# Patient Record
Sex: Female | Born: 1995 | Race: Black or African American | Hispanic: No | Marital: Single | Smoking: Never smoker
Health system: Southern US, Community
[De-identification: ages and names within clinical notes are randomized; demographics above are authoritative.]

## PROBLEM LIST (undated history)

## (undated) DIAGNOSIS — Z789 Other specified health status: Secondary | ICD-10-CM

## (undated) HISTORY — DX: Other specified health status: Z78.9

## (undated) HISTORY — PX: TONSILLECTOMY: SUR1361

---

## 2020-11-28 ENCOUNTER — Inpatient Hospital Stay (HOSPITAL_COMMUNITY): Payer: BC Managed Care – PPO

## 2020-11-28 ENCOUNTER — Other Ambulatory Visit: Payer: Self-pay

## 2020-11-28 ENCOUNTER — Encounter (HOSPITAL_COMMUNITY): Payer: Self-pay | Admitting: Obstetrics and Gynecology

## 2020-11-28 ENCOUNTER — Inpatient Hospital Stay (HOSPITAL_COMMUNITY)
Admission: AD | Admit: 2020-11-28 | Discharge: 2020-11-28 | Disposition: A | Discharge status: Home / Self Care | Payer: BC Managed Care – PPO | Attending: Obstetrics and Gynecology | Admitting: Obstetrics and Gynecology

## 2020-11-28 DIAGNOSIS — O26891 Other specified pregnancy related conditions, first trimester: Secondary | ICD-10-CM | POA: Diagnosis not present

## 2020-11-28 DIAGNOSIS — Z3A01 Less than 8 weeks gestation of pregnancy: Secondary | ICD-10-CM | POA: Insufficient documentation

## 2020-11-28 DIAGNOSIS — O4691 Antepartum hemorrhage, unspecified, first trimester: Secondary | ICD-10-CM

## 2020-11-28 DIAGNOSIS — R109 Unspecified abdominal pain: Secondary | ICD-10-CM | POA: Diagnosis not present

## 2020-11-28 DIAGNOSIS — O209 Hemorrhage in early pregnancy, unspecified: Secondary | ICD-10-CM | POA: Insufficient documentation

## 2020-11-28 DIAGNOSIS — Z6740 Type O blood, Rh positive: Secondary | ICD-10-CM | POA: Diagnosis not present

## 2020-11-28 DIAGNOSIS — O3680X0 Pregnancy with inconclusive fetal viability, not applicable or unspecified: Secondary | ICD-10-CM | POA: Diagnosis not present

## 2020-11-28 DIAGNOSIS — O4690 Antepartum hemorrhage, unspecified, unspecified trimester: Secondary | ICD-10-CM

## 2020-11-28 LAB — COMPREHENSIVE METABOLIC PANEL
ALT: 12 U/L (ref 0–44)
AST: 16 U/L (ref 15–41)
Albumin: 4 g/dL (ref 3.5–5.0)
Alkaline Phosphatase: 52 U/L (ref 38–126)
Anion gap: 10 (ref 5–15)
BUN: 13 mg/dL (ref 6–20)
CO2: 23 mmol/L (ref 22–32)
Calcium: 9.6 mg/dL (ref 8.9–10.3)
Chloride: 104 mmol/L (ref 98–111)
Creatinine, Ser: 0.85 mg/dL (ref 0.44–1.00)
GFR, Estimated: >60 mL/min (ref >60)
Glucose, Bld: 90 mg/dL (ref 70–99)
Potassium: 4.5 mmol/L (ref 3.5–5.1)
Sodium: 137 mmol/L (ref 135–145)
Total Bilirubin: 0.3 mg/dL (ref 0.3–1.2)
Total Protein: 6.9 g/dL (ref 6.5–8.1)

## 2020-11-28 LAB — URINALYSIS, ROUTINE W REFLEX MICROSCOPIC
Bilirubin Urine: NEGATIVE
Glucose, UA: NEGATIVE mg/dL
Ketones, ur: NEGATIVE mg/dL
Leukocytes,Ua: NEGATIVE
Nitrite: NEGATIVE
Protein, ur: 100 mg/dL — AB
Specific Gravity, Urine: 1.014 (ref 1.005–1.030)
pH: 7 (ref 5.0–8.0)

## 2020-11-28 LAB — CBC WITH DIFFERENTIAL/PLATELET
Abs Immature Granulocytes: 0.04 10*3/uL (ref 0.00–0.07)
Basophils Absolute: 0.1 10*3/uL (ref 0.0–0.1)
Basophils Relative: 1 %
Eosinophils Absolute: 0.1 10*3/uL (ref 0.0–0.5)
Eosinophils Relative: 1 %
HCT: 37.2 % (ref 36.0–46.0)
Hemoglobin: 12.2 g/dL (ref 12.0–15.0)
Immature Granulocytes: 0 %
Lymphocytes Relative: 23 %
Lymphs Abs: 2.4 10*3/uL (ref 0.7–4.0)
MCH: 31 pg (ref 26.0–34.0)
MCHC: 32.8 g/dL (ref 30.0–36.0)
MCV: 94.4 fL (ref 80.0–100.0)
Monocytes Absolute: 0.6 10*3/uL (ref 0.1–1.0)
Monocytes Relative: 6 %
Neutro Abs: 7.1 10*3/uL (ref 1.7–7.7)
Neutrophils Relative %: 69 %
Platelets: 447 10*3/uL — ABNORMAL HIGH (ref 150–400)
RBC: 3.94 MIL/uL (ref 3.87–5.11)
RDW: 12.2 % (ref 11.5–15.5)
WBC: 10.3 10*3/uL (ref 4.0–10.5)
nRBC: 0 % (ref 0.0–0.2)

## 2020-11-28 LAB — ABO/RH: ABO/RH(D): O POS

## 2020-11-28 LAB — POCT PREGNANCY, URINE: Preg Test, Ur: POSITIVE — AB

## 2020-11-28 LAB — HCG, QUANTITATIVE, PREGNANCY: hCG, Beta Chain, Quant, S: 778 m[IU]/mL — ABNORMAL HIGH (ref <5)

## 2020-11-28 MED ORDER — KETOROLAC TROMETHAMINE 60 MG/2ML IM SOLN
60.0000 mg | Freq: Once | INTRAMUSCULAR | Status: AC
  Administered 2020-11-28: 20:00:00 60 mg via INTRAMUSCULAR
  Filled 2020-11-28: qty 2

## 2020-11-28 MED ORDER — IBUPROFEN 600 MG PO TABS: 600.0000 mg | ORAL_TABLET | Freq: Four times a day (QID) | ORAL | 0 refills | Status: AC | PRN

## 2020-11-28 MED ORDER — HYDROCODONE-ACETAMINOPHEN 5-325 MG PO TABS: 1.0000 | ORAL_TABLET | Freq: Four times a day (QID) | ORAL | 0 refills | Status: AC | PRN

## 2020-11-28 NOTE — MAU Note (Signed)
Presents with c/o VB and abdominal cramping.  States VB began today, hasn't worn a sanitary napkin since VB began, reports a lot blood noted upon using restroom.  LMP 10/14/2020.

## 2020-11-28 NOTE — MAU Provider Note (Signed)
History     CSN: [id number]  Arrival date and time: 11/28/20 1625   None     Chief Complaint  Patient presents with  . Vaginal Bleeding  . Abdominal Pain   HPI  Ms.Tanya Reynolds is a 24 y.o. female G1P0 @ [redacted]w[redacted]d with certain LMP here with vaginal bleeding and abdominal pain. She reports Vaginal bleeding and abdominal pain that started today. The pain comes and goes.  The lower abdominal pain radiates around to her lower back. She rates her pain 5/10 at this time. She has not taken anything for the pain.   OB History    Gravida  1   Para      Term      Preterm      AB      Living        SAB      IAB      Ectopic      Multiple      Live Births              Past Medical History:  Diagnosis Date  . Medical history non-contributory     Past Surgical History:  Procedure Laterality Date  . TONSILLECTOMY      Family History  Problem Relation Age of Onset  . Heart attack Maternal Grandfather   . Diabetes Maternal Grandfather   . Heart disease Paternal Grandmother     Social History   Tobacco Use  . Smoking status: Never Smoker  . Smokeless tobacco: Never Used  Substance Use Topics  . Alcohol use: Not Currently    Allergies: No Known Allergies  No medications prior to admission.   Results for orders placed or performed during the hospital encounter of 11/28/20 (from the past 48 hour(s))  Pregnancy, urine POC     Status: Abnormal   Collection Time: 11/28/20  4:39 PM  Result Value Ref Range   Preg Test, Ur POSITIVE (A) NEGATIVE    Comment:        THE SENSITIVITY OF THIS METHODOLOGY IS >24 mIU/mL   Urinalysis, Routine w reflex microscopic Urine, Clean Catch     Status: Abnormal   Collection Time: 11/28/20  4:46 PM  Result Value Ref Range   Color, Urine YELLOW YELLOW   APPearance CLEAR CLEAR   Specific Gravity, Urine 1.014 1.005 - 1.030   pH 7.0 5.0 - 8.0   Glucose, UA NEGATIVE NEGATIVE mg/dL   Hgb urine dipstick SMALL (A)  NEGATIVE   Bilirubin Urine NEGATIVE NEGATIVE   Ketones, ur NEGATIVE NEGATIVE mg/dL   Protein, ur 893 (A) NEGATIVE mg/dL   Nitrite NEGATIVE NEGATIVE   Leukocytes,Ua NEGATIVE NEGATIVE   RBC / HPF 0-5 0 - 5 RBC/hpf   WBC, UA 0-5 0 - 5 WBC/hpf   Bacteria, UA RARE (A) NONE SEEN   Squamous Epithelial / LPF 0-5 0 - 5   Mucus PRESENT     Comment: Performed at [facility], [address] [place], [place] [address]  ABO/Rh     Status: None   Collection Time: 11/28/20  6:28 PM  Result Value Ref Range   ABO/RH(D) O POS    No rh immune globuloin      NOT A RH IMMUNE GLOBULIN CANDIDATE, PT RH POSITIVE Performed at [facility], [address] [place], [place] [address]   CBC with Differential/Platelet     Status: Abnormal   Collection Time: 11/28/20  6:29 PM  Result Value Ref  Range   WBC 10.3 4.0 - 10.5 K/uL   RBC 3.94 3.87 - 5.11 MIL/uL   Hemoglobin 12.2 12.0 - 15.0 g/dL   HCT 67.6 72.0 - 94.7 %   MCV 94.4 80.0 - 100.0 fL   MCH 31.0 26.0 - 34.0 pg   MCHC 32.8 30.0 - 36.0 g/dL   RDW 09.6 28.3 - 66.2 %   Platelets 447 (H) 150 - 400 K/uL   nRBC 0.0 0.0 - 0.2 %   Neutrophils Relative % 69 %   Neutro Abs 7.1 1.7 - 7.7 K/uL   Lymphocytes Relative 23 %   Lymphs Abs 2.4 0.7 - 4.0 K/uL   Monocytes Relative 6 %   Monocytes Absolute 0.6 0.1 - 1.0 K/uL   Eosinophils Relative 1 %   Eosinophils Absolute 0.1 0.0 - 0.5 K/uL   Basophils Relative 1 %   Basophils Absolute 0.1 0.0 - 0.1 K/uL   Immature Granulocytes 0 %   Abs Immature Granulocytes 0.04 0.00 - 0.07 K/uL    Comment: Performed at [facility], [address] [place], [place] [address]  Comprehensive metabolic panel     Status: None   Collection Time: 11/28/20  6:29 PM  Result Value Ref Range   Sodium 137 135 - 145 mmol/L   Potassium 4.5 3.5 - 5.1 mmol/L   Chloride 104 98 - 111 mmol/L   CO2 23 22 - 32 mmol/L   Glucose, Bld 90 70 - 99 mg/dL    Comment: Glucose reference range applies only to samples taken after  fasting for at least 8 hours.   BUN 13 6 - 20 mg/dL   Creatinine, Ser 4.65 0.44 - 1.00 mg/dL   Calcium 9.6 8.9 - 03.5 mg/dL   Total Protein 6.9 6.5 - 8.1 g/dL   Albumin 4.0 3.5 - 5.0 g/dL   AST 16 15 - 41 U/L   ALT 12 0 - 44 U/L   Alkaline Phosphatase 52 38 - 126 U/L   Total Bilirubin 0.3 0.3 - 1.2 mg/dL   GFR, Estimated >46 >56 mL/min    Comment: (NOTE) Calculated using the CKD-EPI Creatinine Equation (2021)    Anion gap 10 5 - 15    Comment: Performed at [facility], [address], [place], [place] [address]  hCG, quantitative, pregnancy     Status: Abnormal   Collection Time: 11/28/20  6:29 PM  Result Value Ref Range   hCG, Beta Chain, Quant, S 778 (H) <5 mIU/mL    Comment:          GEST. AGE      CONC.  (mIU/mL)   <=1 WEEK        5 - 50     2 WEEKS       50 - 500     3 WEEKS       100 - 10,000     4 WEEKS     1,000 - 30,000     5 WEEKS     3,500 - 115,000   6-8 WEEKS     12,000 - 270,000    12 WEEKS     15,000 - 220,000        FEMALE AND NON-PREGNANT FEMALE:     LESS THAN 5 mIU/mL Performed at [facility], [address], [place], [place] [address]    US OB LESS THAN 14 WEEKS WITH [place] TRANSVAGINAL  Result Date: 11/28/2020 CLINICAL DATA:  Pregnant patient in  first-trimester pregnancy with vaginal bleeding. EXAM: OBSTETRIC <14 WK [place] AND TRANSVAGINAL OB US TECHNIQUE: Both transabdominal and transvaginal ultrasound examinations were performed for complete evaluation of the gestation as well as the maternal uterus, adnexal regions, and pelvic cul-de-sac. Transvaginal technique was performed to assess early pregnancy. COMPARISON:  None. FINDINGS: Intrauterine gestational sac: Single, located in the lower uterine segment. Yolk sac:  Not Visualized. Embryo:  Not Visualized. MSD: 4.4 mm   5 w   1 d Subchorionic hemorrhage:  None visualized. Maternal uterus/adnexae: Single intrauterine gestational sac is located in the lower uterine segment. The left ovary appears normal.  The right ovary is not seen. There is no adnexal mass. Trace free fluid in the pelvis appears simple. IMPRESSION: 1. Probable early intrauterine gestational sac, but no yolk sac, fetal pole, or cardiac activity yet visualized. The gestational sac is located in the lower uterine segment. Recommend follow-up quantitative B-HCG levels and follow-up US in 14 days to assess viability. This recommendation follows SRU consensus guidelines: Diagnostic Criteria for Nonviable Pregnancy Early in the First Trimester. Malva Limes Med 2013; 818:2993-71. 2. No subchorionic hemorrhage. 3. No findings to suggest ectopic pregnancy. Electronically Signed   By: Narda Rutherford M.D.   On: 11/28/2020 19:34   Review of Systems  Constitutional: Negative for fever.  Gastrointestinal: Positive for abdominal pain.  Genitourinary: Positive for vaginal bleeding.   Physical Exam   Blood pressure 131/69, pulse 82, temperature 98.3 F (36.8 C), temperature source Oral, resp. rate 20, height 5\' 10"  (1.778 m), weight 67.8 kg, last menstrual period 10/14/2020, SpO2 100 %.  Physical Exam Vitals and nursing note reviewed. Exam conducted with a chaperone present.  Constitutional:      General: She is not in acute distress.    Appearance: She is well-developed. She is not ill-appearing, toxic-appearing or diaphoretic.  HENT:     Head: Normocephalic.  Abdominal:     Tenderness: There is no abdominal tenderness.  Genitourinary:    Comments: Bimanual exam: Cervix closed, anterior. Small amount of dark red blood noted on glove with clots felt in pelvis.  Enlarged uterus  Chaperone present for exam.  Neurological:     Mental Status: She is alert and oriented to person, place, and time.  Psychiatric:        Behavior: Behavior normal.    MAU Course  Procedures  None  MDM  O positive blood type.  HIV, CBC, Hcg, ABO 10/16/2020 OB transvaginal  Toradol 60 mg IV prior to dC home.   Assessment and Plan   A:  1. Pregnancy of  unknown anatomic location   2. Vaginal bleeding during pregnancy   3. Type O blood, Rh positive     P:  Discharge home in stable condition Rx: Ibuprofen, Vicodin # 6 Return to MAU Friday evening for repeat Quant Discussed Monday and labs in detail, discussed poor prognoses. Support given Bleeding precautions  Candiss Galeana, [place], NP 11/28/2020 8:30 PM

## 2020-11-28 NOTE — Discharge Instructions (Signed)
Human Chorionic Gonadotropin Test Why am I having this test? A human chorionic gonadotropin (hCG) test is done to determine whether you are pregnant. It can also be used:  To diagnose an abnormal pregnancy.  To determine whether you have had a failed pregnancy (miscarriage) or are at risk of one. What is being tested? This test checks the level of the human chorionic gonadotropin (hCG) hormone in the blood. This hormone is produced during pregnancy by the cells that form the placenta. The placenta is the organ that grows inside your womb (uterus) to nourish a developing baby. When you are pregnant, hCG can be detected in your blood or urine 7 to 8 days before your missed period. It continues to go up for the first 8-10 weeks of pregnancy. The presence of hCG in your blood can be measured with several different types of tests. You may have:  A urine test. ? Because this hormone is eliminated from your body by your kidneys, you may have a urine test to find out whether you are pregnant. A home pregnancy test detects whether there is hCG in your urine. ? A urine test only shows whether there is hCG in your urine. It does not measure how much.  A qualitative blood test. ? You may have this type of blood test to find out if you are pregnant. ? This blood test only shows whether there is hCG in your blood. It does not measure how much.  A quantitative blood test. ? This type of blood test measures the amount of hCG in your blood. ? You may have this test to:  Diagnose an abnormal pregnancy.  Check whether you have had a miscarriage.  Determine whether you are at risk of a miscarriage. What kind of sample is taken?     Two kinds of samples may be collected to test for the hCG hormone.  Blood. It is usually collected by inserting a needle into a blood vessel.  Urine. It is usually collected by urinating into a germ-free (sterile) specimen cup. It is best to collect the sample the first  time you urinate in the morning. How do I prepare for this test? No preparation is needed for a blood test.  For the urine test:  Let your health care provider know about: ? All medicines you are taking, including vitamins, herbs, creams, and over-the-counter medicines. ? Any blood in your urine. This may interfere with the result.  Do not drink too much fluid. Drink as you normally would, or as directed by your health care provider. How are the results reported? Depending on the type of test that you have, your test results may be reported as values. Your health care provider will compare your results to normal ranges that were established after testing a large group of people (reference ranges). Reference ranges may vary among labs and hospitals. For this test, common reference ranges that show absence of pregnancy are:  Quantitative hCG blood levels: less than 5 IU/L. Other results will be reported as either positive or negative. For this test, normal results (meaning the absence of pregnancy) are:  Negative for hCG in the urine test.  Negative for hCG in the qualitative blood test. What do the results mean? Urine and qualitative blood test  A negative result could mean: ? That you are not pregnant. ? That the test was done too early in your pregnancy to detect hCG in your blood or urine. If you still have other signs   of pregnancy, the test will be repeated.  A positive result means: ? That you are most likely pregnant. Your health care provider may confirm your pregnancy with an imaging study (ultrasound) of your uterus, if needed. Quantitative blood test Results of the quantitative hCG blood test will be interpreted as follows:  Less than 5 IU/L: You are most likely not pregnant.  Greater than 25 IU/L: You are most likely pregnant.  hCG levels that are higher than expected: ? You are pregnant with twins. ? You have abnormal growths in the uterus.  hCG levels that are  rising more slowly than expected: ? You have an ectopic pregnancy (also called a tubal pregnancy).  hCG levels that are falling: ? You may be having a miscarriage. Talk with your health care provider about what your results mean. Questions to ask your health care provider Ask your health care provider, or the department that is doing the test:  When will my results be ready?  How will I get my results?  What are my treatment options?  What other tests do I need?  What are my next steps? Summary  A human chorionic gonadotropin test is done to determine whether you are pregnant.  When you are pregnant, hCG can be detected in your blood or urine 7 to 8 days before your missed period. It continues to go up for the first 8-10 weeks of pregnancy.  Your hCG level can be measured with different types of tests. You may have a urine test, a qualitative blood test, or a quantitative blood test.  Talk with your health care provider about what your results mean. This information is not intended to replace advice given to you by your health care provider. Make sure you discuss any questions you have with your health care provider. Document Revised: 11/02/2017 Document Reviewed: 11/02/2017 Elsevier Patient Education  2020 [employer]vier Inc.  

## 2020-11-30 ENCOUNTER — Inpatient Hospital Stay (HOSPITAL_COMMUNITY)
Admission: AD | Admit: 2020-11-30 | Discharge: 2020-11-30 | Disposition: A | Discharge status: Home / Self Care | Payer: BC Managed Care – PPO | Attending: Obstetrics and Gynecology | Admitting: Obstetrics and Gynecology

## 2020-11-30 DIAGNOSIS — O034 Incomplete spontaneous abortion without complication: Secondary | ICD-10-CM | POA: Diagnosis not present

## 2020-11-30 DIAGNOSIS — R109 Unspecified abdominal pain: Secondary | ICD-10-CM | POA: Insufficient documentation

## 2020-11-30 DIAGNOSIS — O26891 Other specified pregnancy related conditions, first trimester: Secondary | ICD-10-CM | POA: Diagnosis not present

## 2020-11-30 DIAGNOSIS — O039 Complete or unspecified spontaneous abortion without complication: Secondary | ICD-10-CM

## 2020-11-30 LAB — HCG, QUANTITATIVE, PREGNANCY: hCG, Beta Chain, Quant, S: 134 m[IU]/mL — ABNORMAL HIGH (ref <5)

## 2020-11-30 NOTE — MAU Note (Signed)
Patient here for repeat bloodwork.  Denies any other complaints.

## 2020-11-30 NOTE — MAU Provider Note (Signed)
Event Date/Time   First Provider Initiated Contact with Patient 11/30/20 1618      S Ms. Tanya Reynolds is a 24 y.o. G1P0 patient who presents to MAU today for repeat quant hCG. She was evaluated in MAU on 11/28/2020. She believes that she has miscarried. She reports that the majority of her bleeding resolved after discharge from MAU on 11/28/2020. She is not saturating a pad and denies dizziness, weakness, syncope. She continues to experience minor abdominal pain which she is managing with Motrin 600 mg q 6 hours. She has not needed to take the Vicodin she was prescribed.   O BP 122/65   Pulse 78   Temp 98 F (36.7 C)   Resp 16   LMP 10/14/2020    Physical Exam Vitals and nursing note reviewed. Exam conducted with a chaperone present.  Cardiovascular:     Rate and Rhythm: Normal rate.     Pulses: Normal pulses.  Pulmonary:     Effort: Pulmonary effort is normal.  Skin:    Capillary Refill: Capillary refill takes less than 2 seconds.  Neurological:     Mental Status: She is oriented to person, place, and time.  Psychiatric:        Mood and Affect: Mood normal.        Behavior: Behavior normal.        Thought Content: Thought content normal.        Judgment: Judgment normal.    A Medical screening exam complete Quant hCG 778 on 11/28/2020 Quant hCG now 134 Discussed concern for miscarriage in progress, no interventions indicated today Given phone number for MAU RN desk in event of questions between now and outpatient appointment Advised non-stat Quant hCG in about one week, can work around holiday Patient prefers [employer], will send secure in-basket to Admin at [facility]   P Discharge from MAU in stable condition Warning signs for worsening condition that would warrant emergency follow-up discussed Patient may return to MAU as needed   Clayton Bibles, [facility] 11/30/2020 6:16 PM

## 2020-11-30 NOTE — Discharge Instructions (Signed)
Miscarriage A miscarriage is the loss of an unborn baby (fetus) before the 20th week of pregnancy. Most miscarriages happen during the first 3 months of pregnancy. Sometimes, a miscarriage can happen before a woman knows that she is pregnant. Having a miscarriage can be an emotional experience. If you have had a miscarriage, talk with your health care provider about any questions you may have about miscarrying, the grieving process, and your plans for future pregnancy. What are the causes? A miscarriage may be caused by:  Problems with the genes or chromosomes of the fetus. These problems make it impossible for the baby to develop normally. They are often the result of random errors that occur early in the development of the baby, and are not passed from parent to child (not inherited).  Infection of the cervix or uterus.  Conditions that affect hormone balance in the body.  Problems with the cervix, such as the cervix opening and thinning before pregnancy is at term (cervical insufficiency).  Problems with the uterus. These may include: ? A uterus with an abnormal shape. ? Fibroids in the uterus. ? Congenital abnormalities. These are problems that were present at birth.  Certain medical conditions.  Smoking, drinking alcohol, or using drugs.  Injury (trauma). In many cases, the cause of a miscarriage is not known. What are the signs or symptoms? Symptoms of this condition include:  Vaginal bleeding or spotting, with or without cramps or pain.  Pain or cramping in the abdomen or lower back.  Passing fluid, tissue, or blood clots from the vagina. How is this diagnosed? This condition may be diagnosed based on:  A physical exam.  Ultrasound.  Blood tests.  Urine tests. How is this treated? Treatment for a miscarriage is sometimes not necessary if you naturally pass all the tissue that was in your uterus. If necessary, this condition may be treated with:  Dilation and  curettage (D&C). This is a procedure in which the cervix is stretched open and the lining of the uterus (endometrium) is scraped. This is done only if tissue from the fetus or placenta remains in the body (incomplete miscarriage).  Medicines, such as: ? Antibiotic medicine, to treat infection. ? Medicine to help the body pass any remaining tissue. ? Medicine to reduce (contract) the size of the uterus. These medicines may be given if you have a lot of bleeding. If you have Rh negative blood and your baby was Rh positive, you will need a shot of a medicine called Rh immunoglobulinto protect your future babies from Rh blood problems. "Rh-negative" and "Rh-positive" refer to whether or not the blood has a specific protein found on the surface of red blood cells (Rh factor). Follow these instructions at home: Medicines   Take over-the-counter and prescription medicines only as told by your health care provider.  If you were prescribed antibiotic medicine, take it as told by your health care provider. Do not stop taking the antibiotic even if you start to feel better.  Do not take NSAIDs, such as aspirin and ibuprofen, unless they are approved by your health care provider. These medicines can cause bleeding. Activity  Rest as directed. Ask your health care provider what activities are safe for you.  Have someone help with home and family responsibilities during this time. General instructions  Keep track of the number of sanitary pads you use each day and how soaked (saturated) they are. Write down this information.  Monitor the amount of tissue or blood clots that   you pass from your vagina. Save any large amounts of tissue for your health care provider to examine.  Do not use tampons, douche, or have sex until your health care provider approves.  To help you and your partner with the process of grieving, talk with your health care provider or seek counseling.  When you are ready, meet with  your health care provider to discuss any important steps you should take for your health. Also, discuss steps you should take to have a healthy pregnancy in the future.  Keep all follow-up visits as told by your health care provider. This is important. Where to find more information  The American Congress of Obstetricians and Gynecologists: www.acog.org  [place] Department of Health and Human Services Office of Women's Health: www.womenshealth.gov Contact a health care provider if:  You have a fever or chills.  You have a foul smelling vaginal discharge.  You have more bleeding instead of less. Get help right away if:  You have severe cramps or pain in your back or abdomen.  You pass blood clots or tissue from your vagina that is walnut-sized or larger.  You soak more than 1 regular sanitary pad in an hour.  You become light-headed or weak.  You pass out.  You have feelings of sadness that take over your thoughts, or you have thoughts of hurting yourself. Summary  Most miscarriages happen in the first 3 months of pregnancy. Sometimes miscarriage happens before a woman even knows that she is pregnant.  Follow your health care provider's instruction for home care. Keep all follow-up appointments.  To help you and your partner with the process of grieving, talk with your health care provider or seek counseling. This information is not intended to replace advice given to you by your health care provider. Make sure you discuss any questions you have with your health care provider. Document Revised: 03/25/2019 Document Reviewed: 01/06/2017 Elsevier Patient Education  2020 [employer]   Managing Pregnancy Loss Pregnancy loss can happen any time during a pregnancy. Often the cause is not known. It is rarely because of anything you did. Pregnancy loss in early pregnancy (during the first trimester) is called a miscarriage. This type of pregnancy loss is the most common. Pregnancy loss  that happens after 20 weeks of pregnancy is called fetal demise if the baby's heart stops beating before birth. Fetal demise is much less common. Some women experience spontaneous labor shortly after fetal demise resulting in a stillborn birth (stillbirth). Any pregnancy loss can be devastating. You will need to recover both physically and emotionally. Most women are able to get pregnant again after a pregnancy loss and deliver a healthy baby. How to manage emotional recovery  Pregnancy loss is very hard emotionally. You may feel many different emotions while you grieve. You may feel sad and angry. You may also feel guilty. It is normal to have periods of crying. Emotional recovery can take longer than physical recovery. It is different for everyone. Taking these steps can help you in managing this loss:  Remember that it is unlikely you did anything to cause the pregnancy loss.  Share your thoughts and feelings with friends, family, and your partner. Remember that your partner is also recovering emotionally.  Make sure you have a good support system. Do not spend too much time alone.  Meet with a pregnancy loss counselor or join a pregnancy loss support group.  Get enough sleep and eat a healthy diet. Return to regular exercise   when you have recovered physically.  Do not use drugs or alcohol to manage your emotions.  Consider seeing a mental health professional to help you recover emotionally.  Ask a friend or loved one to help you decide what to do with any clothing and nursery items you received for your baby. In the case of a stillbirth, many women benefit from taking additional steps in the grieving process. You may want to:  Hold your baby after the birth.  Name your baby.  Request a birth certificate.  Create a keepsake such as handprints or footprints.  Dress your baby and have a picture taken.  Make funeral arrangements.  Ask for a baptism or blessing. Hospitals have  staff members who can help you with all these arrangements. How to recognize emotional stress It is normal to have emotional stress after a pregnancy loss. But emotional stress that lasts a long time or becomes severe requires treatment. Watch out for these signs of severe emotional stress:  Sadness, anger, or guilt that is not going away and is interfering with your normal activities.  Relationship problems that have occurred or gotten worse since the pregnancy loss.  Signs of depression that last longer than 2 weeks. These may include: ? Sadness. ? Anxiety. ? Hopelessness. ? Loss of interest in activities you enjoy. ? Inability to concentrate. ? Trouble sleeping or sleeping too much. ? Loss of appetite or overeating. ? Thoughts of death or of hurting yourself. Follow these instructions at home:  Take over-the-counter and prescription medicines only as told by your health care provider.  Rest at home until your energy level returns. Return to your normal activities as told by your health care provider. Ask your health care provider what activities are safe for you.  When you are ready, meet with your health care provider to discuss steps to take for a future pregnancy.  Keep all follow-up visits as told by your health care provider. This is important. Where to find support  To help you and your partner with the process of grieving, talk with your health care provider or seek counseling.  Consider meeting with others who have experienced pregnancy loss. Ask your health care provider about support groups and resources. Where to find more information  [place] Department of Health and Human Services Office on Women's Health: www.womenshealth.gov  American Pregnancy Association: www.americanpregnancy.org Contact a health care provider if:  You continue to experience grief, sadness, or lack of motivation for everyday activities, and those feelings do not improve over time.  You are  struggling to recover emotionally, especially if you are using alcohol or substances to help. Get help right away if:  You have thoughts of hurting yourself or others. If you ever feel like you may hurt yourself or others, or have thoughts about taking your own life, get help right away. You can go to your nearest emergency department or call:  Your local emergency services (911 in the [place]  A suicide crisis helpline, such as the National Suicide Prevention Lifeline at 1-800-273-8255. This is open 24 hours a day. Summary  Any pregnancy loss can be difficult physically and emotionally.  You may experience many different emotions while you grieve. Emotional recovery can last longer than physical recovery.  It is normal to have emotional stress after a pregnancy loss. But emotional stress that lasts a long time or becomes severe requires treatment.  See your health care provider if you are struggling emotionally after a pregnancy loss. This information   is not intended to replace advice given to you by your health care provider. Make sure you discuss any questions you have with your health care provider. Document Revised: 03/23/2019 Document Reviewed: 02/11/2018 Elsevier Patient Education  2020 [employer] Inc.  

## 2020-12-10 ENCOUNTER — Other Ambulatory Visit: Payer: Self-pay

## 2020-12-10 ENCOUNTER — Ambulatory Visit (INDEPENDENT_AMBULATORY_CARE_PROVIDER_SITE_OTHER): Payer: BC Managed Care – PPO

## 2020-12-10 VITALS — BP 114/68 | HR 65 | Wt 148.0 lb

## 2020-12-10 DIAGNOSIS — O200 Threatened abortion: Secondary | ICD-10-CM

## 2020-12-10 NOTE — Progress Notes (Addendum)
Pt presents for repeat Beta hCG. Pt states she is not having any bleeding just lower back pain. Pt advised to go to [facility] if she starts bleeding heavy like a period. Understanding was voiced. Pt was sent to the lab.  Kriste Broman l Logan Vegh, CMA   Attestation of Attending Supervision of CMA/RN: Evaluation and management procedures were performed by the nurse under my supervision and collaboration.  I have reviewed the nursing note and chart, and I agree with the management and plan.  Carolyn L. Harraway-Smith, M.D., [facility]

## 2020-12-11 ENCOUNTER — Telehealth: Payer: Self-pay

## 2020-12-11 LAB — BETA HCG QUANT (REF LAB): hCG Quant: 4 m[IU]/mL

## 2020-12-11 NOTE — Telephone Encounter (Signed)
Patient called and made aware her HCG level was 4 - non pregnant level. Patient states understanding. Patient offered appointment for follow up or birth control and states she doesn't need it at this time. Armandina Stammer RN

## 2020-12-11 NOTE — Telephone Encounter (Signed)
-----   Message from Willodean Rosenthal, MD sent at 12/11/2020 10:43 AM EST ----- Please call pt. This results indicates that she is not pregnant.   Clh-S

## 2022-07-18 IMAGING — US US OB < 14 WEEKS - US OB TV
1 series · 15 of 28 positions shown · non-contrast
Comparison: None.

CLINICAL DATA: Pregnant patient in first-trimester pregnancy with
vaginal bleeding.

EXAM:
OBSTETRIC <14 WK US AND TRANSVAGINAL OB US
TECHNIQUE: Both transabdominal and transvaginal ultrasound examinations were
performed for complete evaluation of the gestation as well as the
maternal uterus, adnexal regions, and pelvic cul-de-sac.
Transvaginal technique was performed to assess early pregnancy.

[Series 1: us ob < 14 weeks - us ob tv · 15 of 37 slices shown]
[im 1/37]
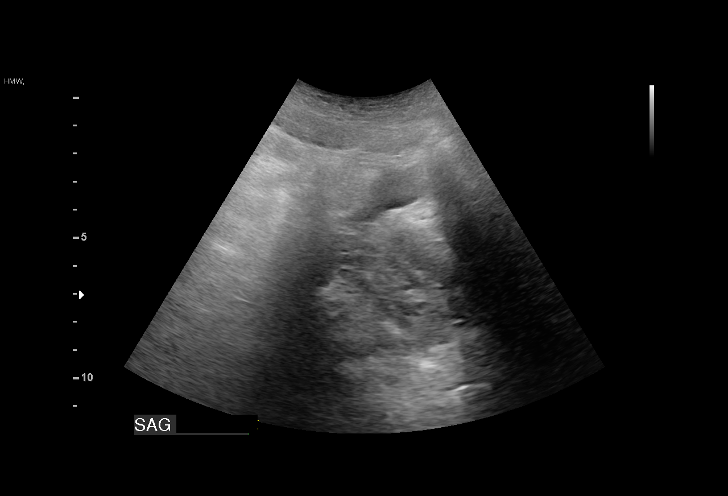
[im 3/37]
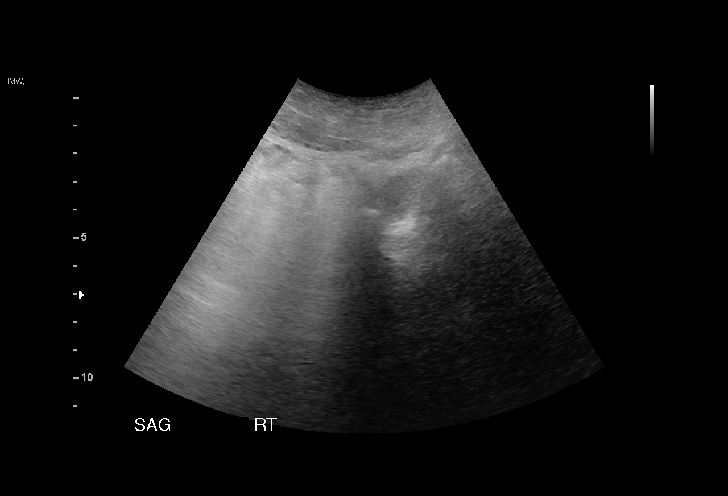
[im 6/37]
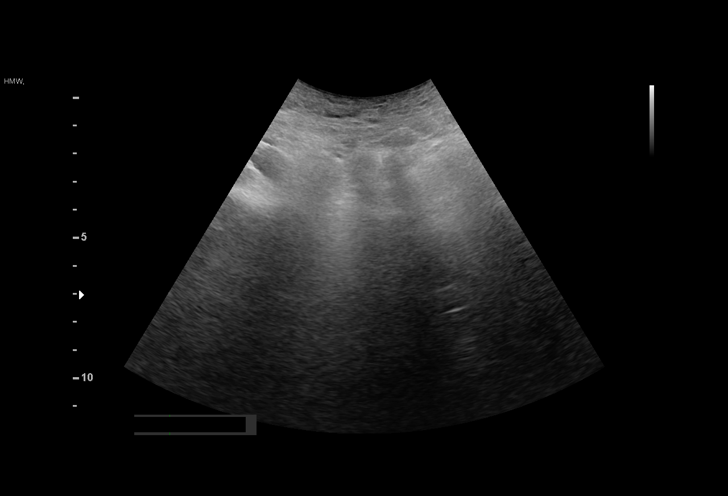
[im 9/37]
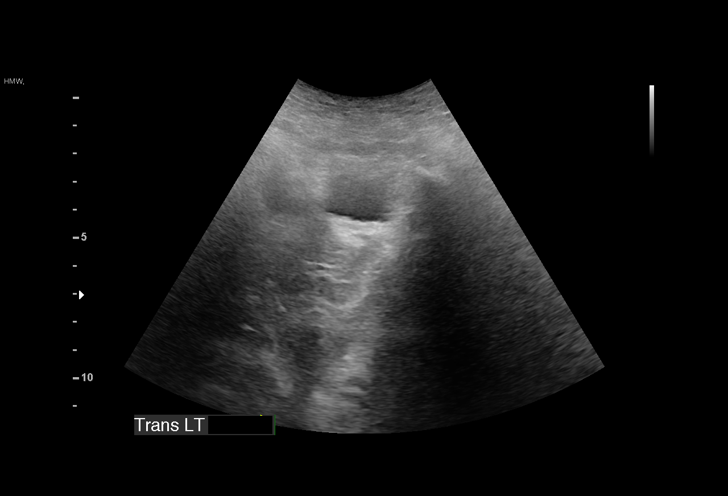
[im 11/37]
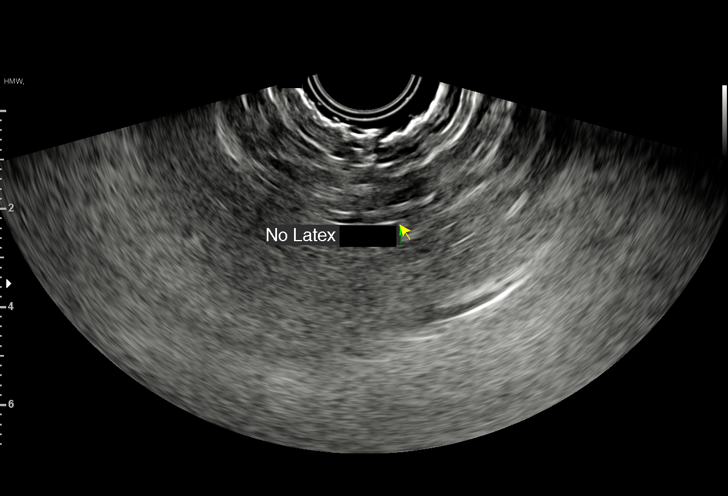
[im 14/37]
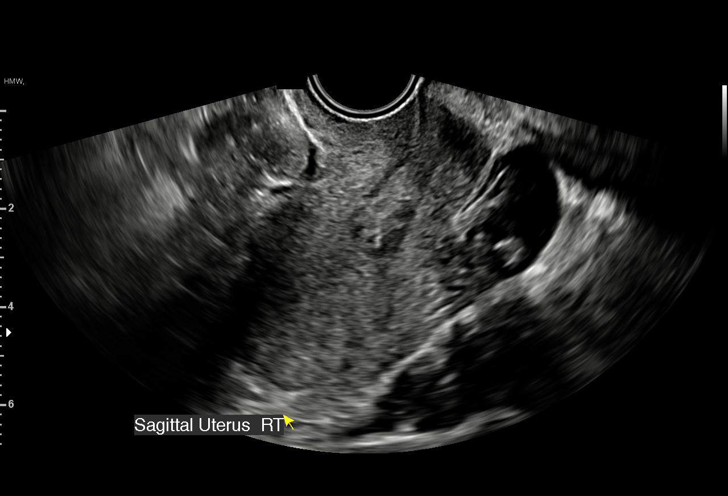
[im 17/37]
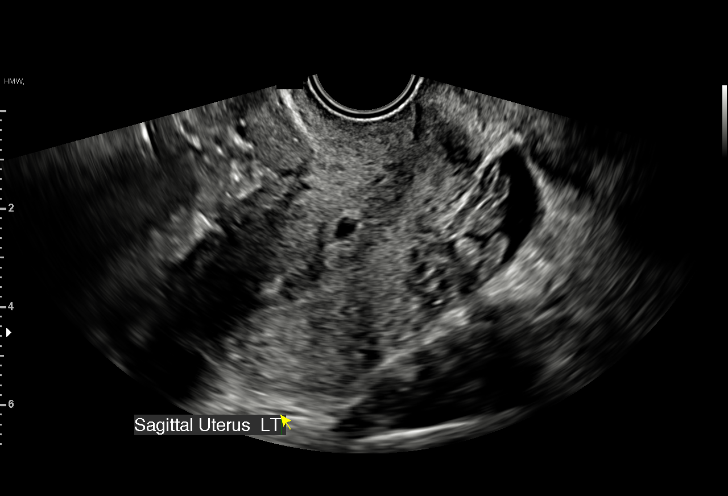
[im 19/37]
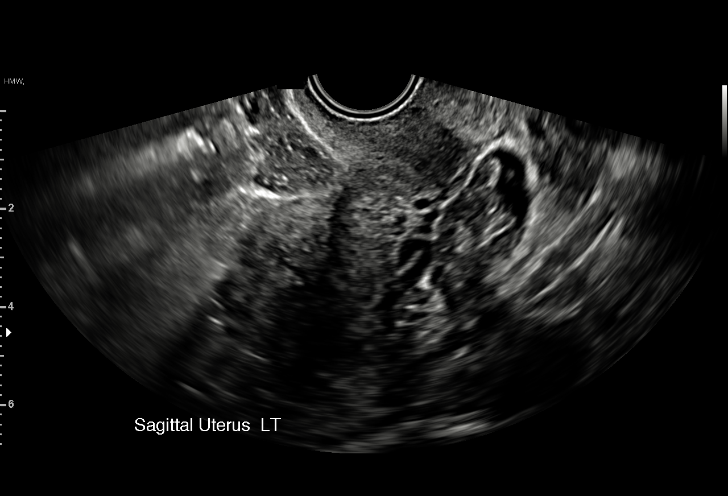
[im 21/37]
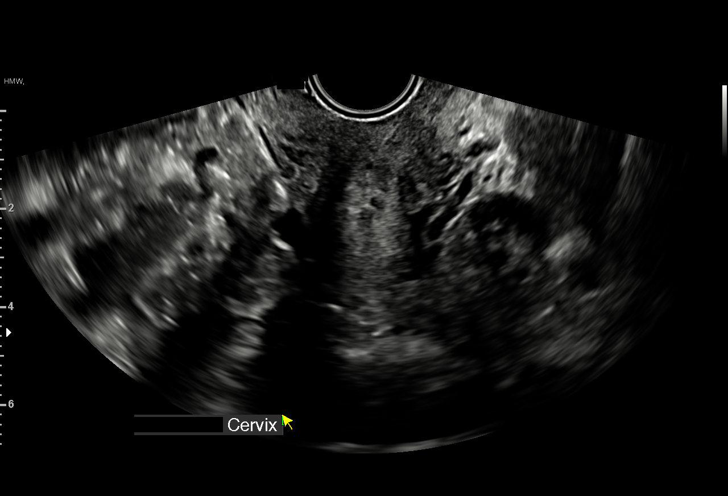
[im 23/37]
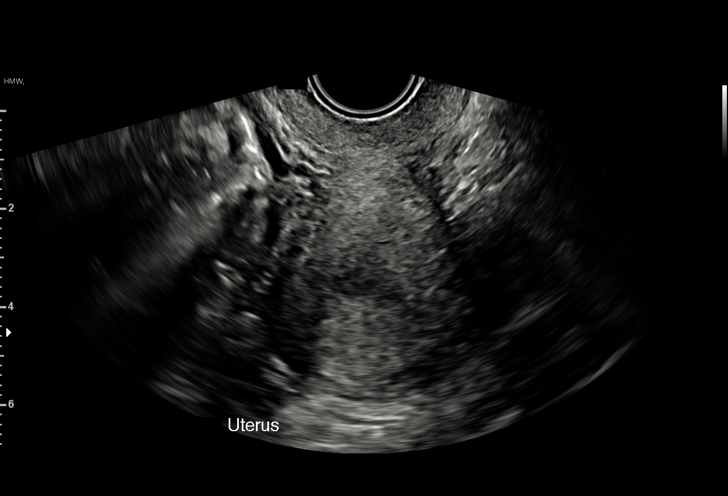
[im 26/37]
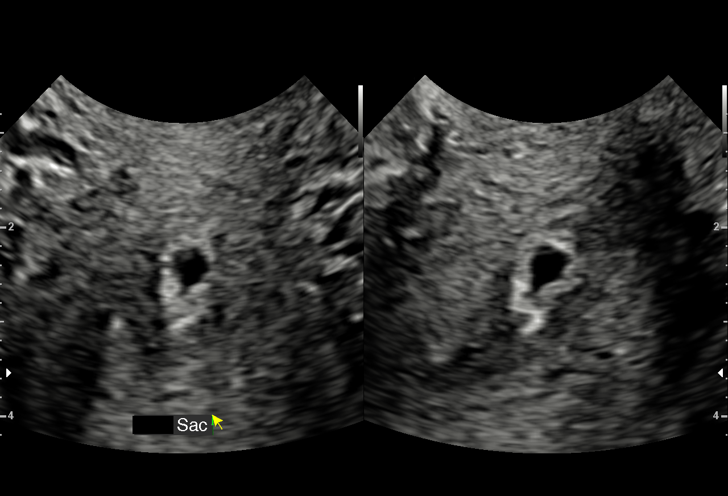
[im 29/37]
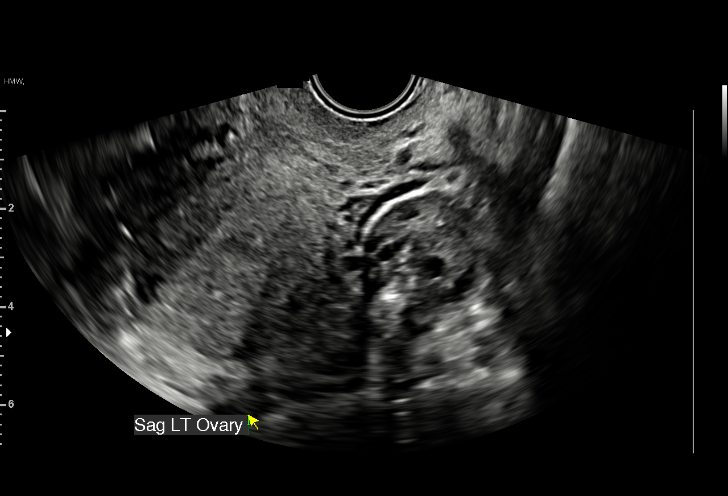
[im 31/37]
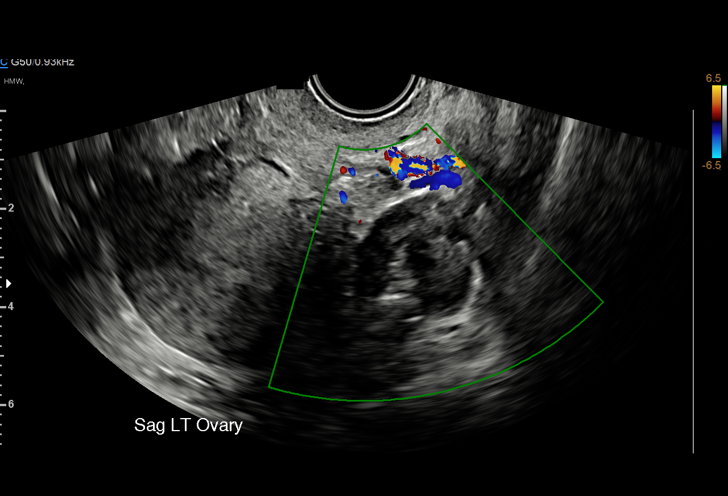
[im 34/37]
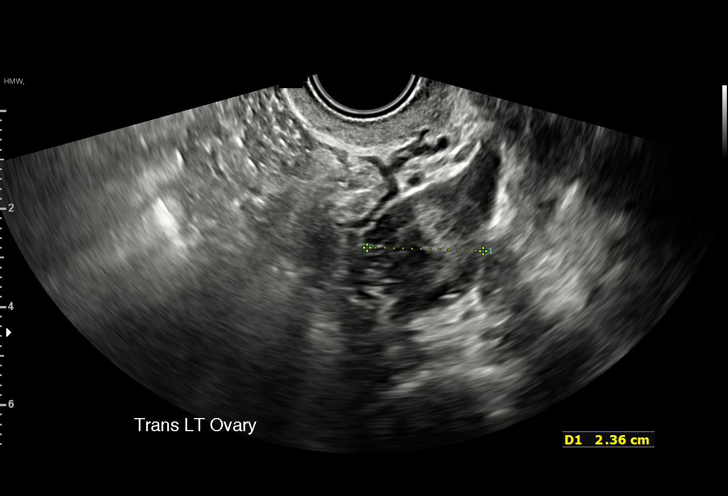
[im 37/37]
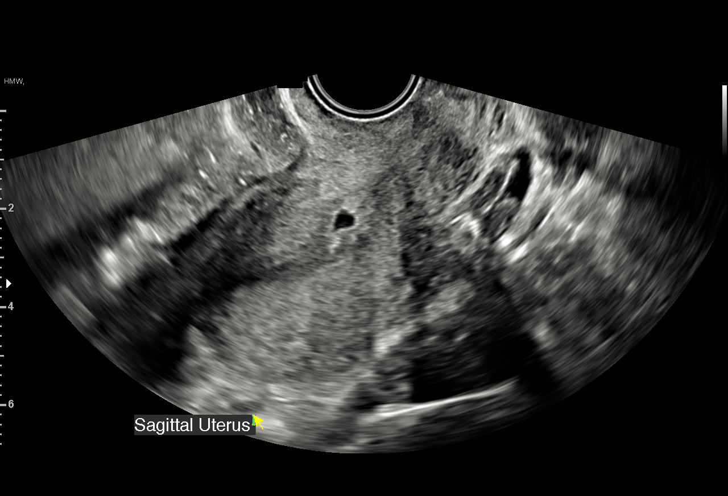

[15 of 28 positions shown; findings below may reference images not displayed]

FINDINGS: Intrauterine gestational sac: Single, located in the lower uterine
segment.

Yolk sac:  Not Visualized.

Embryo:  Not Visualized.

MSD: 4.4 mm   5 w   1 d

Subchorionic hemorrhage:  None visualized.

Maternal uterus/adnexae: Single intrauterine gestational sac is
located in the lower uterine segment. The left ovary appears normal.
The right ovary is not seen. There is no adnexal mass. Trace free
fluid in the pelvis appears simple.
IMPRESSION: 1. Probable early intrauterine gestational sac, but no yolk sac,
fetal pole, or cardiac activity yet visualized. The gestational sac
is located in the lower uterine segment. Recommend follow-up
quantitative B-HCG levels and follow-up US in 14 days to assess
viability. This recommendation follows SRU consensus guidelines:
Diagnostic Criteria for Nonviable Pregnancy Early in the First
Trimester. N Engl J Med 5131; [DATE].
2. No subchorionic hemorrhage.
3. No findings to suggest ectopic pregnancy.
